# Patient Record
Sex: Male | Born: 2000 | Race: White | Hispanic: No | Marital: Single | Smoking: Never smoker
Health system: Southern US, Community
[De-identification: ages and names within clinical notes are randomized; demographics above are authoritative.]

---
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
## 2023-06-29 ENCOUNTER — Encounter (HOSPITAL_COMMUNITY): Payer: Self-pay

## 2023-06-29 ENCOUNTER — Emergency Department (HOSPITAL_COMMUNITY)
Admission: EM | Admit: 2023-06-29 | Discharge: 2023-06-29 | Disposition: A | Discharge status: Home / Self Care | Attending: Emergency Medicine | Admitting: Emergency Medicine

## 2023-06-29 ENCOUNTER — Other Ambulatory Visit: Payer: Self-pay

## 2023-06-29 DIAGNOSIS — M545 Low back pain, unspecified: Secondary | ICD-10-CM | POA: Insufficient documentation

## 2023-06-29 MED ORDER — METHOCARBAMOL 500 MG PO TABS
500.0000 mg | ORAL_TABLET | Freq: Once | ORAL | Status: AC
  Administered 2023-06-29: 500 mg via ORAL
  Filled 2023-06-29: qty 1

## 2023-06-29 MED ORDER — NAPROXEN 500 MG PO TABS: 500.0000 mg | ORAL_TABLET | Freq: Two times a day (BID) | ORAL | 0 refills | Status: AC

## 2023-06-29 MED ORDER — METHOCARBAMOL 500 MG PO TABS: 500.0000 mg | ORAL_TABLET | Freq: Two times a day (BID) | ORAL | 0 refills | Status: AC | PRN

## 2023-06-29 MED ORDER — DEXAMETHASONE SODIUM PHOSPHATE 10 MG/ML IJ SOLN
10.0000 mg | Freq: Once | INTRAMUSCULAR | Status: AC
Start: 2023-06-29 — End: 2023-06-29
  Administered 2023-06-29: 10 mg via INTRAMUSCULAR
  Filled 2023-06-29: qty 1

## 2023-06-29 MED ORDER — KETOROLAC TROMETHAMINE 60 MG/2ML IM SOLN
60.0000 mg | Freq: Once | INTRAMUSCULAR | Status: AC
  Administered 2023-06-29: 60 mg via INTRAMUSCULAR
  Filled 2023-06-29: qty 2

## 2023-06-29 NOTE — ED Provider Notes (Signed)
 [facility] Provider Note   CSN: [id number] Arrival date & time: 06/29/23  1751     History  Chief Complaint  Patient presents with   Back Pain    lower    JERAMY DIMMICK is a 23 y.o. male.   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  It comes on about every 6 months, it last for several days, he feels like he cannot move in his lower back because it makes the pain worse, this includes coughing or sneezing, he denies numbness or weakness of the legs, he denies difficulty urinating, denies fevers, denies IV drug use, denies cancer, denies any back surgery.  No incontinence, over-the-counter meds prior to arrival without much improvement    Home Medications Prior to Admission medications   Medication Sig Start Date End Date Taking? Authorizing Provider  methocarbamol (ROBAXIN) 500 MG tablet Take 1 tablet (500 mg total) by mouth 2 (two) times daily as needed for muscle spasms. 06/29/23  Yes Early Glisson, MD  naproxen (NAPROSYN) 500 MG tablet Take 1 tablet (500 mg total) by mouth 2 (two) times daily with a meal. 06/29/23  Yes Early Glisson, MD  ibuprofen  (ADVIL ,MOTRIN ) 600 MG tablet Take 1 tablet (600 mg total) by mouth every 6 (six) hours as needed. Take with food 12/07/14   Triplett, Tammy, PA-C      Allergies    Patient has no known allergies.    Review of Systems   Review of Systems  Musculoskeletal:  Positive for back pain.  All other systems reviewed and are negative.   Physical Exam Updated Vital Signs BP (!) 149/95 (BP Location: Right Arm)   Pulse 93   Temp  97.7 F (36.5 C) (Temporal)   Resp 16   Ht 1.753 m (5\' 9" )   Wt 65.8 kg   SpO2 96%   BMI 21.42 kg/m  Physical Exam Vitals and nursing note reviewed.  Constitutional:      General: He is not in acute distress.    Appearance: He is well-developed.  HENT:     Head: Normocephalic and atraumatic.     Mouth/Throat:     Pharynx: No oropharyngeal exudate.  Eyes:     General: No scleral icterus.       Right eye: No discharge.        Left eye: No discharge.     Conjunctiva/sclera: Conjunctivae normal.     Pupils: Pupils are equal, round, and reactive to light.  Neck:     Thyroid: No thyromegaly.     Vascular: No JVD.  Cardiovascular:     Rate and Rhythm: Normal rate and regular rhythm.     Heart sounds: Normal heart sounds. No murmur heard.    No friction rub. No gallop.  Pulmonary:     Effort: Pulmonary effort is normal. No respiratory distress.     Breath sounds: Normal breath sounds. No wheezing or rales.  Abdominal:     General: Bowel sounds are normal. There is no distension.     Palpations: Abdomen is  soft. There is no mass.     Tenderness: There is no abdominal tenderness.  Musculoskeletal:        General: Tenderness present. Normal range of motion.     Cervical back: Normal range of motion and neck supple.     Right lower leg: No edema.     Left lower leg: No edema.     Comments: Mild ttp over the mid to lower back centrally and LL back - no abd ttp.  Normal legs with strength, sensation and reflexes at the patellar tendons bilaterallyi.  Lymphadenopathy:     Cervical: No cervical adenopathy.  Skin:    General: Skin is warm and dry.     Findings: No erythema or rash.  Neurological:     Mental Status: He is alert.     Coordination: Coordination normal.  Psychiatric:        Behavior: Behavior normal.     ED Results / Procedures / Treatments   Labs (all labs ordered are listed, but only abnormal results are displayed) Labs Reviewed - No data to  display  EKG None  Radiology No results found.  Procedures Procedures    Medications Ordered in ED Medications  dexamethasone (DECADRON) injection 10 mg (has no administration in time range)  methocarbamol (ROBAXIN) tablet 500 mg (has no administration in time range)  ketorolac (TORADOL) injection 60 mg (has no administration in time range)    ED Course/ Medical Decision Making/ A&P                                 Medical Decision Making Risk Prescription drug management.   ` The patient's history and exam is consistent with having muscle spasms of his back, I have recommended anti-inflammatory muscle relaxer and a Decadron shot, the patient is agreeable.  He has driven himself here and I think he will be good going home to fill these medications at home, I have offered him a work note for a couple of days given how uncomfortable he is but he states that he cannot miss work says he just started the job.  The patient understands indications for return.  In this case of atraumatic recurrent back pain I do not think this is something that needs an x-ray or MRI        Final Clinical Impression(s) / ED Diagnoses Final diagnoses:  Acute low back pain without sciatica, unspecified back pain laterality    Rx / DC Orders ED Discharge Orders          Ordered    naproxen (NAPROSYN) 500 MG tablet  2 times daily with meals        06/29/23 1933    methocarbamol (ROBAXIN) 500 MG tablet  2 times daily PRN        06/29/23 1933              Early Glisson, MD 06/29/23 [phone]

## 2023-06-29 NOTE — Discharge Instructions (Addendum)
 Most back pain -gets better within 2 weeks, use the medications as prescribed including Naprosyn twice a day, Robaxin as needed for a muscle relaxer twice a day, return for severe or worsening pain weakness numbness fevers or any worsening symptoms but this should get better over the week  If you do not have a doctor see the list below  [facility] Primary Care Doctor List    Alberteen Huge, MD. Specialty: [facility] Medicine Contact information: [address]  [place] [address]  [phone]   Charlotta Cook, MD. Specialty: Family Medicine Contact information: [address]  [place] [address]  [phone]   Clary Crown, MD Specialty: Internal Medicine Contact information: [address] [place] [address]  [phone]   Lucendia Rusk, MD. Specialty: Internal Medicine Contact information: [address]  [place] [address]  [phone]    [facility] (Dr. Burdette Carolin) Specialty: Family Medicine Contact information: [address]  [place] [address]  [phone]   Artemisa Bile, MD. Specialty: Internal Medicine Contact information: [address]  [address]  [place] [address]  [phone]   [facility]  [address] [place], [place] [address] [phone]  Services [facility] offers a variety of basic health services.  People needing more complex services will be directed to a physician online. Using these virtual visits, doctors can evaluate and prescribe medicine and treatments. There will be no medication on-site, though [facility] will help patients fill their prescriptions at little to no cost.   [facility] [address] [place]  [address]  [phone]     [facility] [address] [place]   [address]   [phone]   For More information please go to: [email or web address]

## 2023-06-29 NOTE — ED Triage Notes (Signed)
 Pt arrived via POV c/o on-going lower back pain. Pt reports he incurred multiple injuries while in the [employer] [facility] and has been unable to [facility] care with [facility] since his discharge last year due to family and responsibilities. 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

## 2023-06-29 NOTE — ED Notes (Signed)
 ED Provider at bedside.
